# Patient Record
Sex: Female | Born: 1960 | Race: White | Hispanic: No | Marital: Single | Smoking: Never smoker
Health system: Southern US, Community
[De-identification: ages and names within clinical notes are randomized; demographics above are authoritative.]

---

## 2019-11-10 ENCOUNTER — Emergency Department (HOSPITAL_BASED_OUTPATIENT_CLINIC_OR_DEPARTMENT_OTHER): Payer: BLUE CROSS/BLUE SHIELD

## 2019-11-10 ENCOUNTER — Emergency Department (HOSPITAL_BASED_OUTPATIENT_CLINIC_OR_DEPARTMENT_OTHER): Payer: Worker's Compensation

## 2019-11-10 ENCOUNTER — Emergency Department (HOSPITAL_BASED_OUTPATIENT_CLINIC_OR_DEPARTMENT_OTHER)
Admission: EM | Admit: 2019-11-10 | Discharge: 2019-11-11 | Disposition: A | Discharge status: Home / Self Care | Payer: Worker's Compensation | Attending: Emergency Medicine | Admitting: Emergency Medicine

## 2019-11-10 ENCOUNTER — Encounter (HOSPITAL_BASED_OUTPATIENT_CLINIC_OR_DEPARTMENT_OTHER): Payer: Self-pay

## 2019-11-10 DIAGNOSIS — W19XXXA Unspecified fall, initial encounter: Secondary | ICD-10-CM

## 2019-11-10 DIAGNOSIS — S20219A Contusion of unspecified front wall of thorax, initial encounter: Secondary | ICD-10-CM | POA: Diagnosis not present

## 2019-11-10 DIAGNOSIS — Y9301 Activity, walking, marching and hiking: Secondary | ICD-10-CM | POA: Diagnosis not present

## 2019-11-10 DIAGNOSIS — Y929 Unspecified place or not applicable: Secondary | ICD-10-CM | POA: Diagnosis not present

## 2019-11-10 DIAGNOSIS — W010XXA Fall on same level from slipping, tripping and stumbling without subsequent striking against object, initial encounter: Secondary | ICD-10-CM | POA: Diagnosis not present

## 2019-11-10 DIAGNOSIS — S0181XA Laceration without foreign body of other part of head, initial encounter: Secondary | ICD-10-CM

## 2019-11-10 DIAGNOSIS — Y999 Unspecified external cause status: Secondary | ICD-10-CM | POA: Insufficient documentation

## 2019-11-10 DIAGNOSIS — Z23 Encounter for immunization: Secondary | ICD-10-CM | POA: Diagnosis not present

## 2019-11-10 DIAGNOSIS — S0990XA Unspecified injury of head, initial encounter: Secondary | ICD-10-CM | POA: Diagnosis present

## 2019-11-10 MED ORDER — TETANUS-DIPHTH-ACELL PERTUSSIS 5-2.5-18.5 LF-MCG/0.5 IM SUSP
0.5000 mL | Freq: Once | INTRAMUSCULAR | Status: AC
  Administered 2019-11-10: 0.5 mL via INTRAMUSCULAR
  Filled 2019-11-10: qty 0.5

## 2019-11-10 MED ORDER — LIDOCAINE HCL (PF) 1 % IJ SOLN
5.0000 mL | Freq: Once | INTRAMUSCULAR | Status: AC
  Administered 2019-11-10: 22:00:00 5 mL
  Filled 2019-11-10: qty 5

## 2019-11-10 NOTE — ED Triage Notes (Signed)
Brought in by EMS , fall with head lac and neck pain

## 2019-11-10 NOTE — ED Notes (Signed)
Irrigated right forehead laceration with 500 ml NS. Pt tolerated well.

## 2019-11-10 NOTE — ED Provider Notes (Signed)
[facility] EMERGENCY DEPARTMENT Provider Note   CSN: [id number] Arrival date & time: 11/10/19  1532     History No chief complaint on file.   Mackenzie Fields is a 59 y.o. female.  HPI Patient presents after fall.  Tripped and fell hitting her head earlier today.  Complaining of pain everywhere.  Complaining pain of anterior chest.  Also pain on upper and lower extremities.  Did not have loss conscious.  Not on anticoagulation.  Last tetanus was around 8-1/2 years ago.  Laceration right forehead.  States some pain in that eye.    History reviewed. No pertinent past medical history.  There are no problems to display for this patient.   History reviewed. No pertinent surgical history.   OB History   No obstetric history on file.     No family history on file.  Social History   Tobacco Use  . Smoking status: Never Smoker  . Smokeless tobacco: Never Used  Substance Use Topics  . Alcohol use: Not on file  . Drug use: Not on file    Home Medications Prior to Admission medications   Not on File    Allergies    Patient has no allergy information on record.  Review of Systems   Review of Systems  Constitutional: Negative for appetite change.  HENT: Negative for congestion.   Respiratory: Negative for shortness of breath.   Cardiovascular: Negative for chest pain.  Gastrointestinal: Negative for abdominal pain.  Genitourinary: Negative for flank pain.  Musculoskeletal: Positive for back pain and neck pain.  Skin: Positive for wound.  Neurological: Negative for weakness.  Psychiatric/Behavioral: Negative for confusion.    Physical Exam Updated Vital Signs BP 138/84 (BP Location: Right Arm)   Pulse 67   Temp 98.5 F (36.9 C)   Resp 14   Ht 5\' 6"  (1.676 m)   Wt 106.6 kg   SpO2 100%   BMI 37.93 kg/m   Physical Exam Vitals and nursing note reviewed.  HENT:     Head:     Comments: Approximately 2 cm spaced laceration on right forehead.  Surrounding  ecchymosis goes down to the periorbital area.  Eye movements intact.  No proptosis or sunken eye. Eyes:     Extraocular Movements: Extraocular movements intact.     Pupils: Pupils are equal, round, and reactive to light.  Cardiovascular:     Rate and Rhythm: Regular rhythm.  Pulmonary:     Comments: Tenderness over anterior chest wall.  No crepitance or deformity. Chest:     Chest wall: Tenderness present.  Abdominal:     Tenderness: There is no abdominal tenderness.  Musculoskeletal:        General: Tenderness present.     Comments: Mild bilateral upper and lower extremity tenderness without clear bony tenderness.  Neurological:     Mental Status: She is alert and oriented to person, place, and time.     ED Results / Procedures / Treatments   Labs (all labs ordered are listed, but only abnormal results are displayed) Labs Reviewed - No data to display  EKG None  Radiology DG Chest 2 View  Result Date: 11/10/2019 CLINICAL DATA:  Recent fall with chest pain, initial encounter EXAM: CHEST - 2 VIEW COMPARISON:  None. FINDINGS: The heart size and mediastinal contours are within normal limits. Both lungs are clear. The visualized skeletal structures are unremarkable. IMPRESSION: No active cardiopulmonary disease. Electronically Signed   By: 01/08/2020.D.  On: 11/10/2019 21:49   CT Head Wo Contrast  Result Date: 11/10/2019 CLINICAL DATA:  Trip and fall, hit head, laceration over right eye EXAM: CT HEAD WITHOUT CONTRAST CT CERVICAL SPINE WITHOUT CONTRAST TECHNIQUE: Multidetector CT imaging of the head and cervical spine was performed following the standard protocol without intravenous contrast. Multiplanar CT image reconstructions of the cervical spine were also generated. COMPARISON:  None. FINDINGS: CT HEAD FINDINGS Brain: No evidence of acute infarction, hemorrhage, hydrocephalus, extra-axial collection or mass lesion/mass effect. Vascular: No hyperdense vessel or unexpected  calcification. Skull: Normal. Negative for fracture or focal lesion. Sinuses/Orbits: No acute finding. Other: Soft tissue laceration and hematoma of the right forehead (series 2, image 15). CT CERVICAL SPINE FINDINGS Alignment: Straightening of the normal cervical lordosis. Skull base and vertebrae: No acute fracture. No primary bone lesion or focal pathologic process. Soft tissues and spinal canal: No prevertebral fluid or swelling. No visible canal hematoma. Disc levels: Moderate disc space height loss and osteophytosis of the lower cervical spine Upper chest: Negative. Other: None. IMPRESSION: 1. No acute intracranial pathology. 2. Soft tissue laceration and hematoma of the right forehead. 3. No fracture or static subluxation of the cervical spine. 4. Moderate disc space height loss and osteophytosis of the lower cervical spine. Electronically Signed   By: Eddie Candle M.D.   On: 11/10/2019 16:35   CT Cervical Spine Wo Contrast  Result Date: 11/10/2019 CLINICAL DATA:  Trip and fall, hit head, laceration over right eye EXAM: CT HEAD WITHOUT CONTRAST CT CERVICAL SPINE WITHOUT CONTRAST TECHNIQUE: Multidetector CT imaging of the head and cervical spine was performed following the standard protocol without intravenous contrast. Multiplanar CT image reconstructions of the cervical spine were also generated. COMPARISON:  None. FINDINGS: CT HEAD FINDINGS Brain: No evidence of acute infarction, hemorrhage, hydrocephalus, extra-axial collection or mass lesion/mass effect. Vascular: No hyperdense vessel or unexpected calcification. Skull: Normal. Negative for fracture or focal lesion. Sinuses/Orbits: No acute finding. Other: Soft tissue laceration and hematoma of the right forehead (series 2, image 15). CT CERVICAL SPINE FINDINGS Alignment: Straightening of the normal cervical lordosis. Skull base and vertebrae: No acute fracture. No primary bone lesion or focal pathologic process. Soft tissues and spinal canal: No  prevertebral fluid or swelling. No visible canal hematoma. Disc levels: Moderate disc space height loss and osteophytosis of the lower cervical spine Upper chest: Negative. Other: None. IMPRESSION: 1. No acute intracranial pathology. 2. Soft tissue laceration and hematoma of the right forehead. 3. No fracture or static subluxation of the cervical spine. 4. Moderate disc space height loss and osteophytosis of the lower cervical spine. Electronically Signed   By: Eddie Candle M.D.   On: 11/10/2019 16:35   CT Lumbar Spine Wo Contrast  Result Date: 11/10/2019 CLINICAL DATA:  Fall.  Back pain EXAM: CT LUMBAR SPINE WITHOUT CONTRAST TECHNIQUE: Multidetector CT imaging of the lumbar spine was performed without intravenous contrast administration. Multiplanar CT image reconstructions were also generated. COMPARISON:  None. FINDINGS: Segmentation: Normal Alignment: Mild retrolisthesis L2-3 and L3-4 Vertebrae: Negative for fracture Paraspinal and other soft tissues: Atherosclerotic aorta without aneurysm. No paraspinous soft tissue mass. Disc levels: L1-2: Disc bulging and Schmorl's node. Small right-sided disc protrusion. Mild spinal stenosis L2-3: Mild disc bulging and facet degeneration without significant stenosis L3-4: Disc bulging and mild facet degeneration. Negative for stenosis L4-5: Diffuse bulging of the disc. Bilateral facet hypertrophy. Mild spinal stenosis and moderate subarticular stenosis bilaterally L5-S1: Mild disc bulging and mild facet degeneration. No significant stenosis.  IMPRESSION: Negative for lumbar fracture Lumbar degenerative changes as above. Atherosclerotic aorta Electronically Signed   By: Marlan Palau M.D.   On: 11/10/2019 16:35    Procedures .Marland KitchenLaceration Repair  Date/Time: 11/10/2019 10:41 PM Performed by: Benjiman Core, MD Authorized by: Benjiman Core, MD   Consent:    Consent obtained:  Verbal   Consent given by:  Patient   Risks discussed:  Infection, pain, tendon  damage, vascular damage, poor wound healing, poor cosmetic result, need for additional repair and nerve damage   Alternatives discussed:  No treatment and delayed treatment Anesthesia (see MAR for exact dosages):    Anesthesia method:  Local infiltration   Local anesthetic:  Lidocaine 1% w/o epi Laceration details:    Location:  Face   Face location:  Forehead   Length (cm):  2 Repair type:    Repair type:  Intermediate Pre-procedure details:    Preparation:  Patient was prepped and draped in usual sterile fashion Exploration:    Hemostasis achieved with:  Direct pressure   Wound exploration: wound explored through full range of motion     Contaminated: no   Treatment:    Area cleansed with:  Saline   Amount of cleaning:  Standard Subcutaneous repair:    Suture size:  4-0   Wound subcutaneous closure material used: vicryl rapide.   Number of sutures:  1 Skin repair:    Repair method:  Sutures   Suture size:  6-0   Suture material:  Prolene   Suture technique:  Running   Number of sutures:  12 Approximation:    Approximation:  Close Post-procedure details:    Dressing:  Sterile dressing   Patient tolerance of procedure:  Tolerated well, no immediate complications   (including critical care time)  Medications Ordered in ED Medications  Tdap (BOOSTRIX) injection 0.5 mL (0.5 mLs Intramuscular Given 11/10/19 2143)  lidocaine (PF) (XYLOCAINE) 1 % injection 5 mL (5 mLs Infiltration Given 11/10/19 2144)    ED Course  I have reviewed the triage vital signs and the nursing notes.  Pertinent labs & imaging results that were available during my care of the patient were reviewed by me and considered in my medical decision making (see chart for details).    MDM Rules/Calculators/A&P                     Patient with fall.  Hit head with forehead laceration.  Wound closed.  Imaging otherwise reassuring.  Does not appear to need more imaging besides the head cervical spine and lumbar  spine.  Discharge home. Final Clinical Impression(s) / ED Diagnoses Final diagnoses:  Fall, initial encounter  Forehead laceration, initial encounter  Chest wall contusion, unspecified laterality, initial encounter    Rx / DC Orders ED Discharge Orders    None       Benjiman Core, MD 11/10/19 2245

## 2019-11-10 NOTE — ED Notes (Signed)
Patient transported to X-ray 

## 2019-11-10 NOTE — Discharge Instructions (Signed)
Have the stitches taken out in around 1 week.

## 2019-11-10 NOTE — ED Triage Notes (Addendum)
Per ems:  Pt tripped and fell, hit her head and right ribs. Lac over right eye.  No LOC.  Bilateral wrist pain.

## 2021-04-12 IMAGING — CT CT L SPINE W/O CM
3 series · 13 of 33 positions shown, 16 images · non-contrast
Comparison: None.

CLINICAL DATA: Fall.  Back pain

EXAM:
CT LUMBAR SPINE WITHOUT CONTRAST
TECHNIQUE: Multidetector CT imaging of the lumbar spine was performed without
intravenous contrast administration. Multiplanar CT image
reconstructions were also generated.

[Series 4: l spine soft · axial · 0.30mm/px · z∈[+750,+916]mm · 5 of 121 slices shown, 7 images]
[im 19/121  soft-tissue]
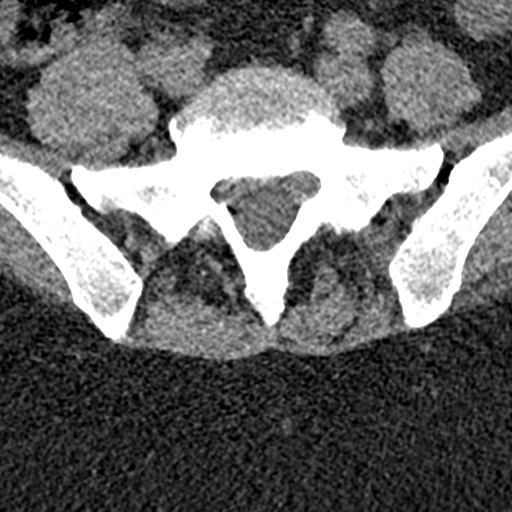
[im 19/121  bone]
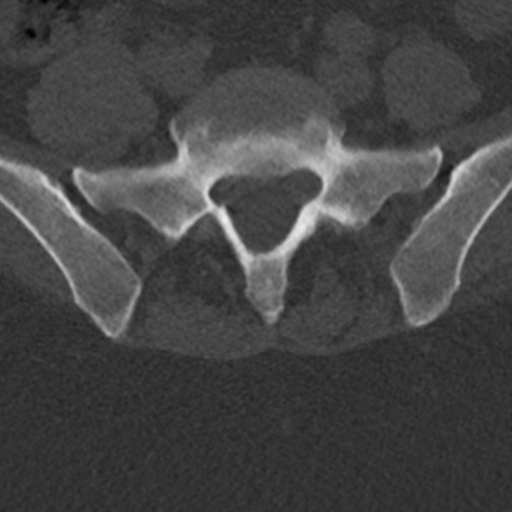
[im 37/121  bone]
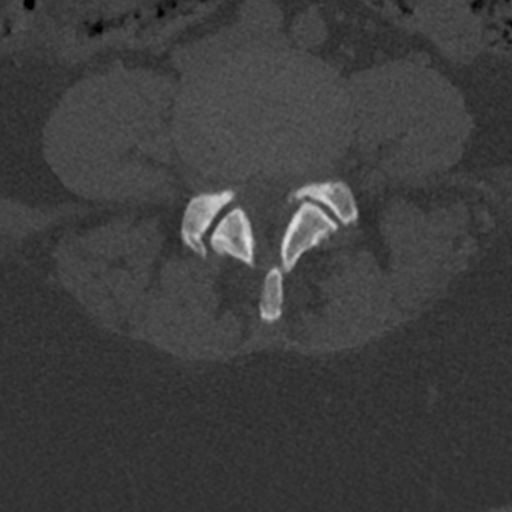
[im 65/121  bone]
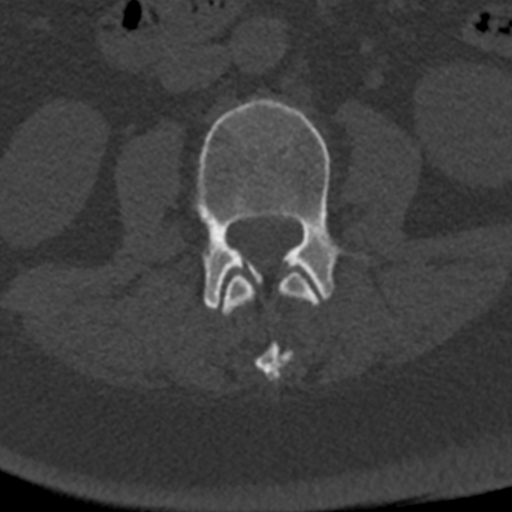
[im 84/121  bone]
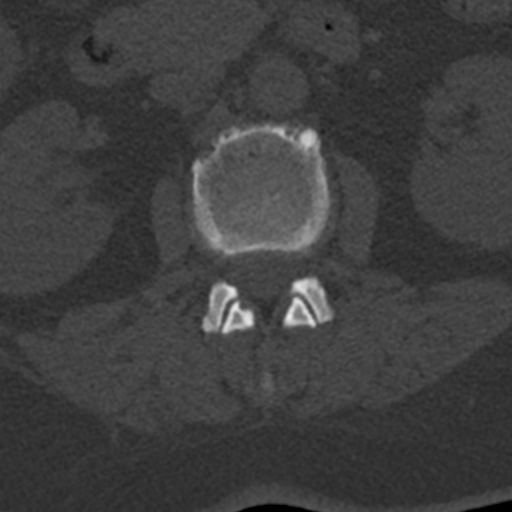
[im 102/121  soft-tissue]
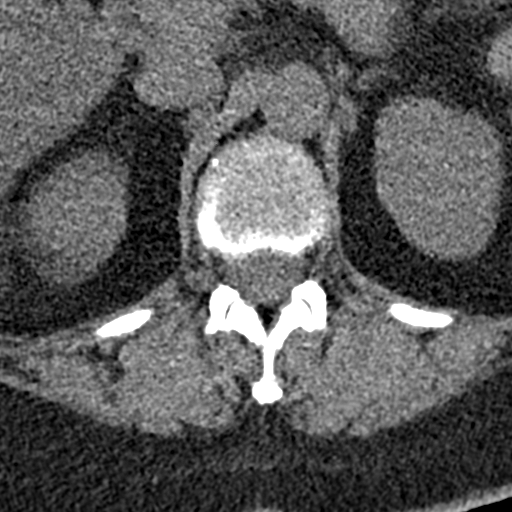
[im 102/121  bone]
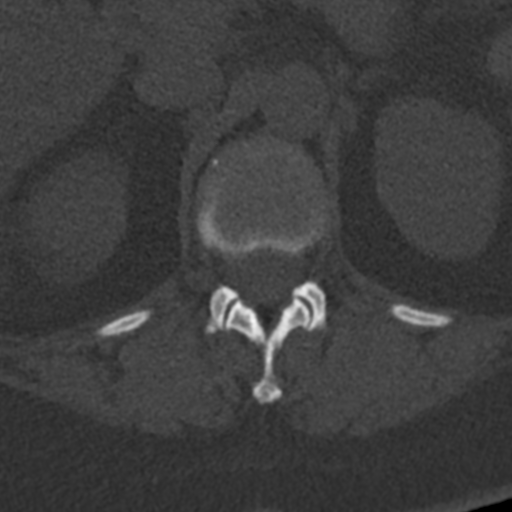

[Series 5: sagittal bone · sagittal · 0.34mm/px · 5 of 76 slices shown, 6 images]
[im 26/76  bone]
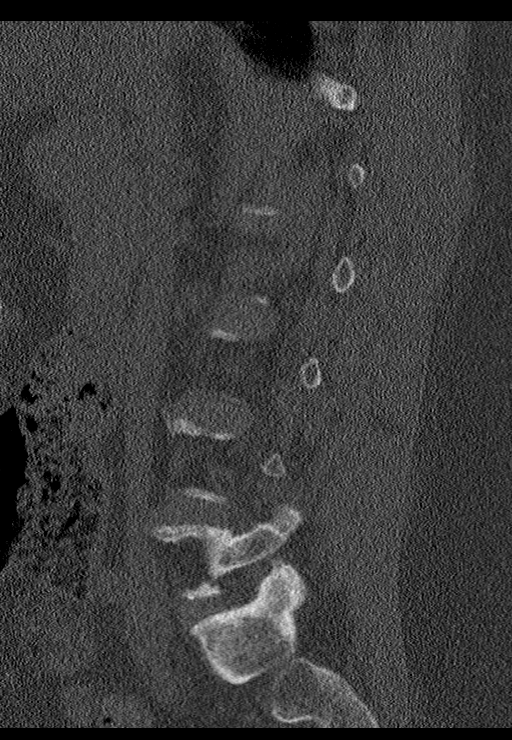
[im 32/76  bone]
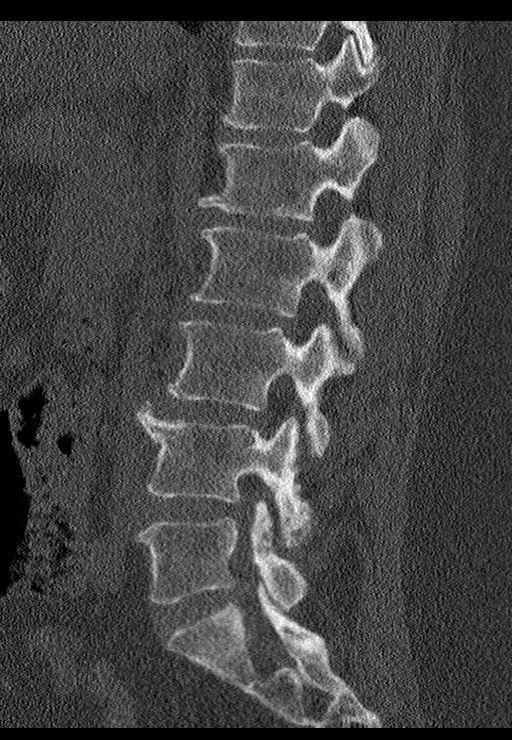
[im 38/76  soft-tissue]
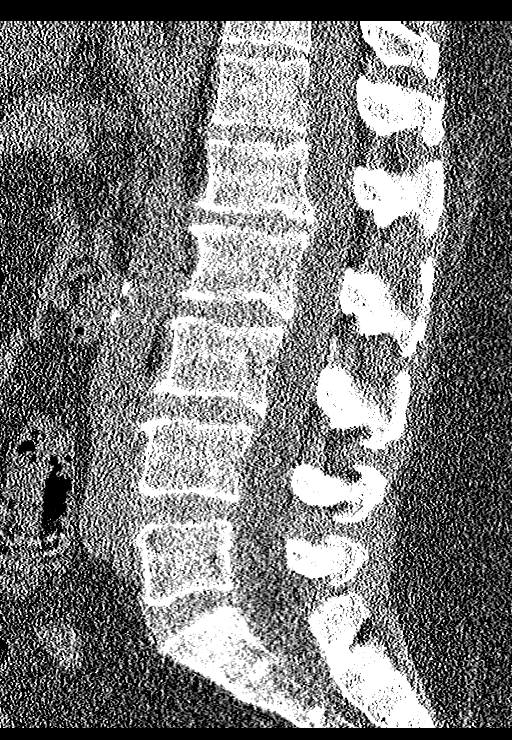
[im 38/76  bone]
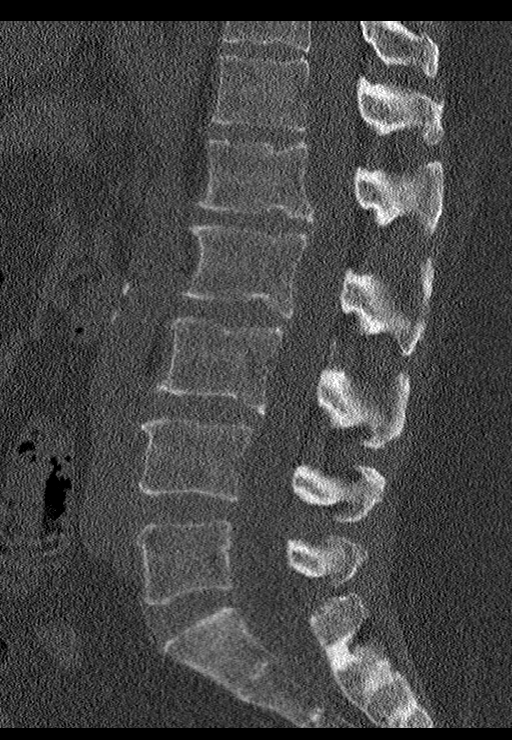
[im 44/76  bone]
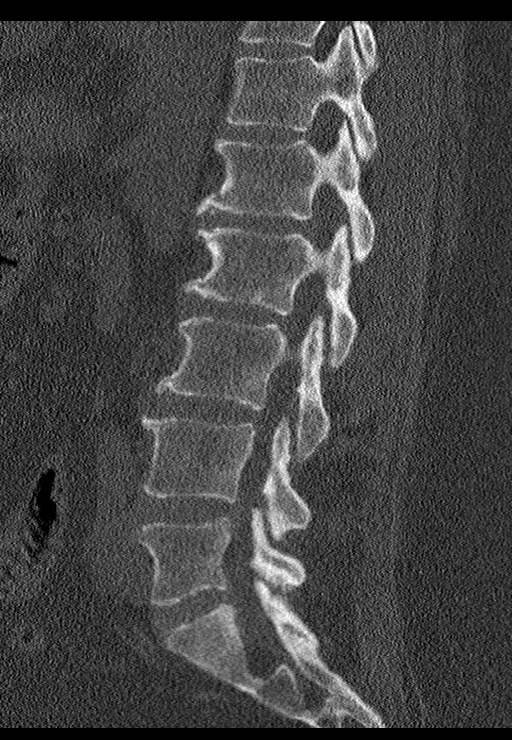
[im 51/76  bone]
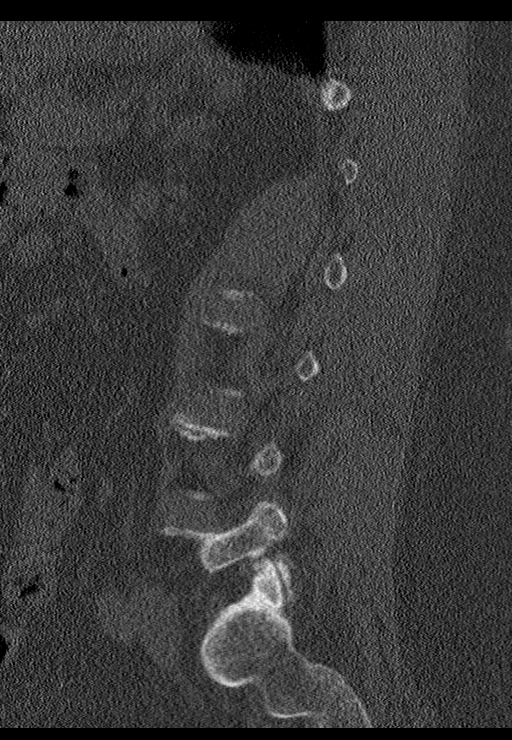

[Series 6: coronal bone · coronal · 0.35mm/px · 3 of 71 slices shown]
[im 15/71  bone]
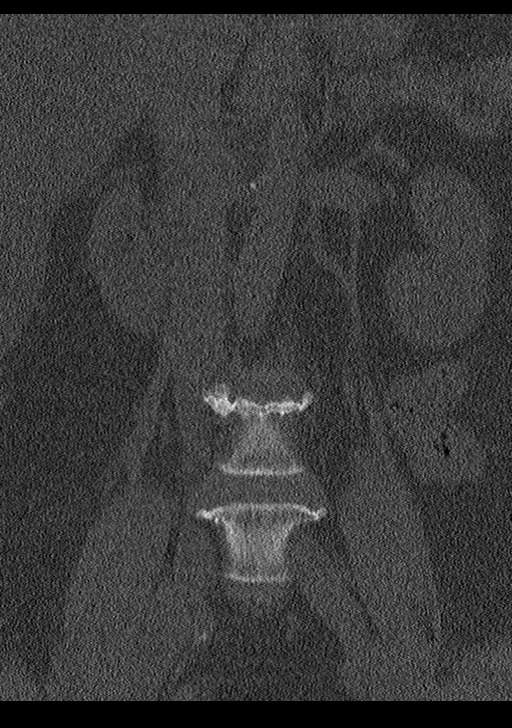
[im 29/71  bone]
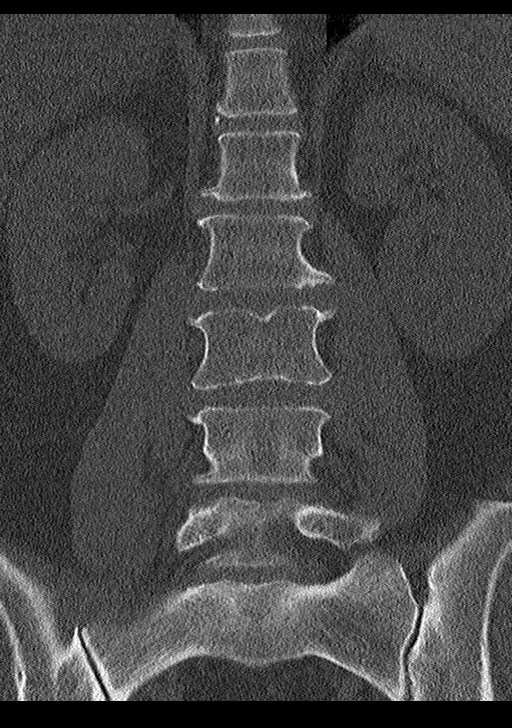
[im 43/71  bone]
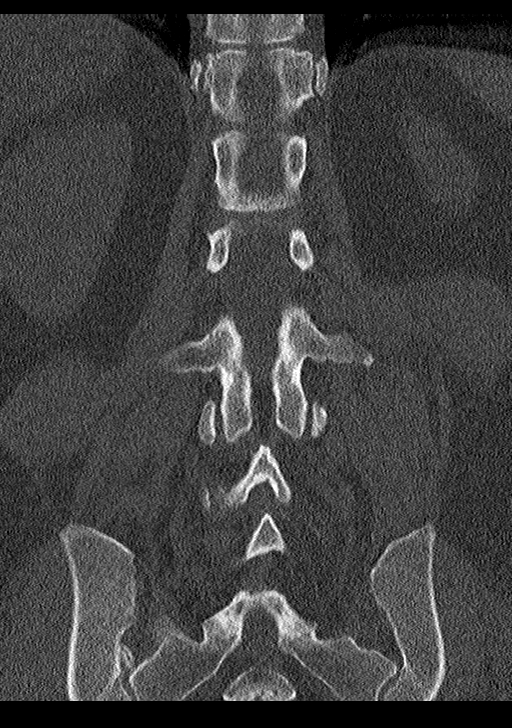

[13 of 33 positions shown; findings below may reference images not displayed]

FINDINGS: Segmentation: Normal

Alignment: Mild retrolisthesis L2-3 and L3-4

Vertebrae: Negative for fracture

Paraspinal and other soft tissues: Atherosclerotic aorta without
aneurysm. No paraspinous soft tissue mass.

Disc levels: L1-2: Disc bulging and Schmorl's node. Small
right-sided disc protrusion. Mild spinal stenosis

L2-3: Mild disc bulging and facet degeneration without significant
stenosis

L3-4: Disc bulging and mild facet degeneration. Negative for
stenosis

L4-5: Diffuse bulging of the disc. Bilateral facet hypertrophy. Mild
spinal stenosis and moderate subarticular stenosis bilaterally

L5-S1: Mild disc bulging and mild facet degeneration. No significant
stenosis.
IMPRESSION: Negative for lumbar fracture

Lumbar degenerative changes as above.

Atherosclerotic aorta
